# Patient Record
Sex: Female | Born: 1945 | Race: White | Hispanic: No | Marital: Married
Health system: Midwestern US, Academic
[De-identification: ages and names within clinical notes are randomized; demographics above are authoritative.]

---

## 2022-02-14 ENCOUNTER — Encounter: Admit: 2022-02-14 | Discharge: 2022-02-14 | Payer: MEDICARE

## 2022-02-18 ENCOUNTER — Ambulatory Visit: Admit: 2022-02-18 | Discharge: 2022-02-19 | Payer: MEDICARE

## 2022-02-18 ENCOUNTER — Encounter: Admit: 2022-02-18 | Discharge: 2022-02-18 | Payer: MEDICARE

## 2022-02-18 DIAGNOSIS — N952 Postmenopausal atrophic vaginitis: Secondary | ICD-10-CM

## 2022-02-18 DIAGNOSIS — E7800 Pure hypercholesterolemia, unspecified: Secondary | ICD-10-CM

## 2022-02-18 DIAGNOSIS — N993 Prolapse of vaginal vault after hysterectomy: Secondary | ICD-10-CM

## 2022-02-18 DIAGNOSIS — I10 Essential (primary) hypertension: Secondary | ICD-10-CM

## 2022-02-18 DIAGNOSIS — C50919 Malignant neoplasm of unspecified site of unspecified female breast: Secondary | ICD-10-CM

## 2022-02-18 MED ORDER — ESTRADIOL 0.01 % (0.1 MG/GRAM) VA CREA
1 g | VAGINAL | 11 refills | 30.00000 days | Status: AC
Start: 2022-02-18 — End: ?

## 2022-02-18 NOTE — Progress Notes
DATE: 02/18/2022  REFERRING PHYSICIAN: Dr Felecia Jan      CHIEF COMPLAINT: New Patient and Prolapse       History of Present Illness  Theresa Andrade is a 76 y.o. G3P3 PMP female seen for consultation regarding prolapse.  She has had a vaginal hysterectomy in 1977 for abnormal uterine bleeding.     She reports prolapse symptoms that started in 2018. She had a rectocele repair in 2018 in [place] and then noticed another bulge and was told this was her bladder.She has noticed a vaginal bulge that she can see and feel. She does not report having pelvic pressure symptoms.  She does manually reduce the bulge, which she feels like she has to do to void.  She has not tried a pessary.    She reports urinary symptoms, namely difficulty voiding when the bulge is out. She denies leakage of urine with coughing, laughing, and sneezing. She denies leakage of urine with an urge. She does not wear a pad for protection. She does not have symptoms of urinary urgency and frequency. She does feel that she empties her bladder completely if she pushes the bulge back in. She wakes once per night to void.     She reports a history of UTIs, about 1-2 per year, most recently was last year.    She denies bowel symptoms.  She denies fecal incontinence of stool. She has had a colonoscopy in 2017 but was told they were unable to pass the scope due to the rectocele and that she needed to have this repaired before she could have a colonoscopy.     She denies a history of back pain, hip pain, knee pain and foot/ankle pain.     She denies menopausal symptoms. She denies vaginal dryness.    She is not sexually active, has not been since her cancer.        She has a history of breast cancer treated with surgery and chemotherapy. She was on Tamoxifen for 10 years and has been off since 2015. She reports that her mammograms are up to date since.  She reports that her last Pap smear was normal.     REVIEW OF SYSTEMS:  Pertinent Review of Systems:  - General ROS: Denies recent weight change above 10 pounds, Denies malaise  - HEENT ROS: Denies blurring of vision. Denies double vision, Denies glaucoma, Denies dry eyes  - Cardiovascular ROS: Denies chest pain. Denies palpitations currently. Denies orthopnea  - Respiratory ROS: Denies shortness of breath,  Denies cough,  Denies wheezing currently  - Gastrointestinal ROS: Denies constipation, Denies gastric reflux,  Denies blood in stool, Denies diarrhea, Denies Irritable bowel syndrome, Denies nausea, Denies vomiting, Denies abdominal pain  - Endocrine ROS: Denies polydipsia,  Denies excessive sweating. Denies hot flashes. Pos Thyroid disease  - Hematological and Lymphatic ROS: Denies easy bruisability , Denies current anemia, Denies adenopathy in the inguinal area  - Neurological ROS: Denies syncope, Denies numbness in lower extremities, Denies neuropathy. Denies shooting pain down the legs, Denies shooting pain in the lower back, Denies low back pain  - Musculoskeletal ROS: Denies Joint pain. Denies Edema in lower extremities. Denies fibromyalgia  - Psychological ROS: Denies depression, Denies anxiety, Denies thoughts of suicide, Denies thoughts of homicide, Denies recent seizure, Denies syncope   - Dermatological ROS: Denies eczema. Denies skin rashes in the groin area and other sites    ALLERGIES:  Patient has no known allergies.    Medical History:  Diagnosis Date   ? Breast cancer (HCC)    ? High cholesterol    ? HTN (hypertension)        Surgical History:   Procedure Laterality Date   ? HERNIA REPAIR  1995   ? MASTECTOMY, PARTIAL Right 2005   ? HX HYSTERECTOMY         History reviewed. No pertinent family history.    Social History     Socioeconomic History   ? Marital status: Married   Tobacco Use   ? Smoking status: Former     Packs/day: 1.00     Years: 20.00     Pack years: 20.00     Types: Cigarettes     Quit date: 1998     Years since quitting: 25.1   ? Smokeless tobacco: Never   Substance and Sexual Activity   ? Alcohol use: Not Currently   ? Drug use: Never   ? Sexual activity: Yes     Partners: Male         Current Outpatient Medications:   ?  levothyroxine (SYNTHROID) 88 mcg tablet, Take one tablet by mouth daily 30 minutes before breakfast., Disp: , Rfl:   ?  lisinopriL (ZESTRIL) 10 mg tablet, Take one tablet by mouth daily., Disp: , Rfl:   ?  simvastatin (ZOCOR) 20 mg tablet, Take one tablet by mouth at bedtime daily., Disp: , Rfl:         Objective:         ? levothyroxine (SYNTHROID) 88 mcg tablet Take one tablet by mouth daily 30 minutes before breakfast.   ? lisinopriL (ZESTRIL) 10 mg tablet Take one tablet by mouth daily.   ? simvastatin (ZOCOR) 20 mg tablet Take one tablet by mouth at bedtime daily.     Vitals:    02/18/22 0858   BP: 129/58   Pulse: 74   Temp: 36.6 ?C (97.9 ?F)   Resp: 16   SpO2: 100%   TempSrc: Temporal   PainSc: Zero   Weight: 90.7 kg (200 lb)   Height: 167.6 cm (5' 6)     Body mass index is 32.28 kg/m?Marland Kitchen     Physical Exam    General appearance: not in acute distress, walking with normal gait  Mental status :oriented to time, place and person; affect and mood appropriate; normal interaction  Cardiovascular: normal heart rate, bilateral LE pulses palpable  Chest / Respiratory: normal respiratory effort, unlabored breathing   Back: No spine tenderness, no SI joint tenderness, No CVAT  Gastrointestinal / Abdomen: no masses, no rebound, no tenderness, soft, nondistended, no hernias,  Supraumbilical (hiatal Hernia) incisions without tenderness   Extremities: no gross deformities, normal joint mobility   Neruologic: no asymmetric weakness in LE, no abnormalities in sensation in LE  Peripheral vascular system: no cyanosis, no edema in LE, extremities warm  Dermatological: no perineal nevi or rashes noted   Genitourinary:    Post Void Residual  (ml)  Void (spontaneous)       PVR by bladder scan 9 mL        Urinalysis  Leuks                 neg      Nitrites                 neg      Heme neg                      (  one choice per column)  Ext. Genitalia      Lesions noted No    Appearance consistent with age Yes   Vagina       Discharge Present No    Mucosa Atrophic Yes    Tissue Friable No    Tissue Tender To Palpation. No    Bladder      Masses Noted No    Tender to Palpation See below   Urethra      Midline Yes    Normal Size Yes    Urethral Tenderness On Palpation See below    Urethral Discharge Noted No    Any Lesions Noted. No     Caruncle None   Uterus        Normal Size S/p hyst    Mobile S/p hyst    Tender S/p hyst   Adnexa       Masses palpated  none    Tenderness noted NT   Bimanual       Masses palpated  None   Perineum       visually intact. Yes    Anus       External Hemorrhoids None   Myofascial Tenderness      Rt OI  0/10       Rt iliococcygeus  3/10       Rt puboccygeus/puborectalis  0/10       Posterior vaginal  2/10       Lt pubococcygeus/puborectalis  2/10       Lt iliococcygeus  0/10       Lt OI  0/10       Bladder  0/10       Urethra  0/10       Lt vulva  0/10       Rt vulva  0 /10       Reflexes Bulbocavernosus Present    Anal wink Present         Oxford Squeeze strength: (0-5) 2  out of 5    Grade 0:  No discernible contraction Grade 1:   Very weak contraction, a?flicker?Marland Kitchen .  Grade 2:   Weak contraction Grade 3: Moderate contraction with some squeeze and lift ability. Grade 4 :   Good contraction; squeeze and lift against resistance.  Grade 5: Strong contraction, squeeze and lift against strong resistance.         Continence Stress Test (supine stress test)  negative      Pelvic Organ Prolapse Quantification (POP-Q):    Aa: +3 Ba:+4 C: +0.5   GH: 3.5/5.5 PB: 3/3 TVL: 7.5   Ap: -0.5 Bp: -0.5 D: n/a     Stage 3         ASSESSMENT:  Theresa Andrade is a 76 y.o. 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   - She is not sexually active.   - She desires surgical management. We discussed the importance of proceeding with colonoscopy screening prior to elective surgery for her prolapse. She reports another provider expressed concern about her ability to pass the colonoscope due to the prolapse. Based on the anatomy of her prolapse, currently, I do not anticipate any challenges passing the colonoscope. She will discuss rescheduling this with  her PCP  - Return for urodynamics testing and surgical consultation    Vaginal atrophy:   - We discussed the use of vaginal estrogen cream. I discussed the risks of systemic estrogen use. I explained that with vaginal application of 1g 2X per week minimal systemic absorption of estrogen occurs.   - We discussed the risks of not using the cream including atrophy, erosion, increased risk of UTI.   - A prescription for vaginal estrogen was sent to her pharmacy

## 2022-02-18 NOTE — Progress Notes
Pertinent Review of Systems:  - General ROS: Denies recent weight change above 10 pounds, Denies malaise  - HEENT ROS: Denies blurring of vision. Denies double vision, Denies glaucoma, Denies dry eyes  - Cardiovascular ROS: Denies chest pain. Denies palpitations currently. Denies orthopnea  - Respiratory ROS: Denies shortness of breath,  Denies cough,  Denies wheezing currently  - Gastrointestinal ROS: Denies constipation, Denies gastric reflux,  Denies blood in stool, Denies diarrhea, Denies Irritable bowel syndrome, Denies nausea, Denies vomiting, Denies abdominal pain  - Endocrine ROS: Denies polydipsia,  Denies excessive sweating. Denies hot flashes. Pos Thyroid disease  - Hematological and Lymphatic ROS: Denies easy bruisability , Denies current anemia, Denies adenopathy in the inguinal area  - Neurological ROS: Denies syncope, Denies numbness in lower extremities, Denies neuropathy. Denies shooting pain down the legs, Denies shooting pain in the lower back, Denies low back pain  - Musculoskeletal ROS: Denies Joint pain. Denies Edema in lower extremities. Denies fibromyalgia  - Psychological ROS: Denies depression, Denies anxiety, Denies thoughts of suicide, Denies thoughts of homicide, Denies recent seizure, Denies syncope   - Dermatological ROS: Denies eczema. Denies skin rashes in the groin area and other sites

## 2022-02-18 NOTE — Patient Instructions
Thank you for visiting the [facility].   We strive to provide personalized and compassionate care.  We look forward to taking care of you!        The Urogynecology Team,     Providers:       Acie Fredrickson MD, Robbie Lis MD, [facility] PA-C, and Alexander PA-C     Nurses:       Oneida Alar RN, [facility] LPN, [facility] RN, and Waldemar Dickens RN       How to contact us:  Please send a MyChart message to Dr. Randell Patient or call us directly at [phone] for medical questions or future scheduling needs.        Urogynecology is a surgical subspecialty requiring Dr. Enis Gash and Dr. Randell Patient to be in the OR several days of the week.  During these days the nursing staff and physician assistants are available for phone calls and questions. Dr. Randell Patient is in the office seeing patients in clinic on Mondays, alternating Thursdays, and alternating Fridays. Due to our clinic volume, our entire staff is providing patient care with various providers every day.  Urgent messages will be returned.  Non-urgent calls will be addressed by the end of the next business day.  Messages left after 1:00 pm may not be answered until the following business day.    For any emergency outside of normal business hours, please call [phone] and request to have the on-call Gynecologist paged.    How to get a medication refill:   Please request refills through your pharmacy or use the MyChart Refill request. If you have any questions or need further assistance, please call our office [phone]).     How to get approval for a medication insurance is denying:   If a medication is prescribed and insurance is denying coverage for the drug, you have the option of paying for the drug out of pocket or contacting our office to discuss an alternative.  Good Rx (www.goodrx.com) is an online site that can save up to 80% off of your medication cost.      Who do I talk to if I have questions about my bill?  If you need to reach our Billing Department for questions about your bill, call [phone].      How to receive your test results: Results will be directly available in MyChart once finalized. Urine cultures may take up to 72 hours.  OneSwab vaginal cultures, blood work, and pathology can take up to 2 weeks.  Please call our office [phone] if you have not received your results within this time frame or if you have questions about your result.    Scheduling:  Our scheduling phone number is [phone].    Communication preferences can be managed in MyChart to ensure you receive important appointment notifications  ________________________________________________________________________    We have three locations where we see our patients.  Be sure to check your next appointment location.   [facility]    [address]    [place], [place] [address]     MAIN CAMPUS/ MOB ([facility] Office Building at the [facility])    [address]    [place], [place] [address]      [facility] OFFICE    [address], [address]    [place], [place]     _________________________________________________________________________

## 2022-02-18 NOTE — Procedures
Within 10 minutes of voiding, a bladder scan was performed and showed a PVR of 9 mL.

## 2022-02-19 DIAGNOSIS — N8189 Other female genital prolapse: Secondary | ICD-10-CM

## 2022-02-19 DIAGNOSIS — R3989 Other symptoms and signs involving the genitourinary system: Secondary | ICD-10-CM

## 2022-03-20 ENCOUNTER — Encounter: Admit: 2022-03-20 | Discharge: 2022-03-20 | Payer: MEDICARE

## 2022-03-20 ENCOUNTER — Ambulatory Visit: Admit: 2022-03-20 | Discharge: 2022-03-20 | Payer: MEDICARE

## 2022-03-20 DIAGNOSIS — I10 Essential (primary) hypertension: Secondary | ICD-10-CM

## 2022-03-20 DIAGNOSIS — C50919 Malignant neoplasm of unspecified site of unspecified female breast: Secondary | ICD-10-CM

## 2022-03-20 DIAGNOSIS — N993 Prolapse of vaginal vault after hysterectomy: Secondary | ICD-10-CM

## 2022-03-20 DIAGNOSIS — E7800 Pure hypercholesterolemia, unspecified: Secondary | ICD-10-CM

## 2022-03-20 DIAGNOSIS — N952 Postmenopausal atrophic vaginitis: Secondary | ICD-10-CM

## 2022-03-20 MED ORDER — NITROFURANTOIN MONOHYD/M-CRYST 100 MG PO CAP
100 mg | Freq: Once | ORAL | 0 refills | Status: DC
Start: 2022-03-20 — End: 2022-03-20

## 2022-03-20 MED ORDER — PRESURGERY KIT C
PACK | 0 refills | Status: CN
Start: 2022-03-20 — End: ?

## 2022-03-20 MED ORDER — ACETAMINOPHEN 500 MG PO TAB
1000 mg | Freq: Once | ORAL | 0 refills
Start: 2022-03-20 — End: ?

## 2022-03-20 MED ORDER — CEFAZOLIN IN 0.9% SOD CHLORIDE 2 GRAM/110 ML IVPB
2 g | Freq: Once | INTRAVENOUS | 0 refills
Start: 2022-03-20 — End: ?

## 2022-03-20 MED ORDER — LACTATED RINGERS IV SOLP
INTRAVENOUS | 0 refills
Start: 2022-03-20 — End: ?

## 2022-03-20 MED ORDER — AMOXICILLIN 500 MG PO CAP
500 mg | Freq: Once | ORAL | 0 refills | Status: CP
Start: 2022-03-20 — End: ?
  Administered 2022-03-20: 16:00:00 500 mg via ORAL

## 2022-03-20 MED ORDER — GABAPENTIN 300 MG PO CAP
600 mg | Freq: Once | ORAL | 0 refills
Start: 2022-03-20 — End: ?

## 2022-03-20 MED ORDER — CELECOXIB 200 MG PO CAP
200 mg | Freq: Once | ORAL | 0 refills
Start: 2022-03-20 — End: ?

## 2022-03-20 NOTE — Progress Notes
SURGICAL CONSULTATION:    HPI:  Theresa Andrade is a 76 y.o. G3P3 PMP female with Stage 3 post-hysterectomy vaginal vault prolapse. She desires surgical management and returns today for urodynamics and surgical consultation.     She has had a vaginal hysterectomy in 1977 for abnormal uterine bleeding. She had a rectocele repair in 2019 in [place] (operative report in Care Everywhere) and then noticed another bulge and was told this was her bladder.    She went in for a colonoscopy yesterday (03/19/22) but was unable to have it completed. 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 Gleason in 2019. She had a CT yesterday to complete her colon screening and has follow up with Dr. Manson Passey to discuss the results later this month.     Pertinent Review of Systems:  - General ROS:  Denies recent weight change above 10 pounds,  Denies malaise  - HEENT ROS:  Denies blurring of vision.  Denies double vision,  Denies glaucoma,  Denies dry eyes  - Cardiovascular ROS:  Denies chest pain.  Denies palpitations currently.  Denies orthopnea  - Respiratory ROS:  Denies shortness of breath,  Denies cough,  Denies wheezing currently  - Gastrointestinal ROS:  Denies constipation,  Denies gastric reflux,  Denies blood in stool,  Denies diarrhea,  Denies Irritable bowel syndrome,  Denies nausea,  Denies vomiting,  Denies abdominal pain  - Endocrine ROS:  Denies polydipsia,  Denies excessive sweating.  Denies hot flashes.  Denies Thyroid disease  - Hematological and Lymphatic ROS:  Denies easy bruisability,  Denies current anemia,  Denies adenopathy in the inguinal area  - Neurological ROS: Denies syncope,  Denies numbness in lower extremities,  Denies neuropathy.  Denies shooting pain down the legs, Denies shooting pain in the lower back,  Denies low back pain  - Musculoskeletal ROS:  Denies Joint pain.  Denies Edema in lower extremities.  Denies fibromyalgia  - Psychological ROS:  Denies depression,  Denies anxiety,  Denies thoughts of suicide,  Denies thoughts of homicide,  Denies recent seizure,  Denies syncope   - Dermatological ROS:  Denies eczema.  Denies skin rashes in the groin area and other sites    Medical History:   Diagnosis Date   ? Breast cancer (HCC)    ? High cholesterol    ? HTN (hypertension)      Surgical History:   Procedure Laterality Date   ? HERNIA REPAIR  1995   ? MASTECTOMY, PARTIAL Right 2005   ? HX HYSTERECTOMY         OBJECTIVE:  BP 140/75  HR 46  Weight 190 lb  Height 5' 6    Physical Exam    General appearance: not in acute distress, walking with normal gait  Mental status :oriented to time, place and person; affect and mood appropriate; normal interaction  Cardiovascular: normal heart rate, bilateral LE pulses palpable  Chest / Respiratory: normal respiratory effort, unlabored breathing  Gastrointestinal / Abdomen: no masses, no rebound, no tenderness, soft, nondistended, no hernias,  Supraumbilical (hiatal Hernia) incisions without tenderness   Extremities: no gross deformities, normal joint mobility   Neruologic: no asymmetric weakness in LE, no abnormalities in sensation in LE  Peripheral  vascular system: no cyanosis, no edema in LE, extremities warm  Dermatological: no perineal nevi or rashes noted   Genitourinary:   External genitalia: normal appearance   Urethra: no lesions or prolapse   Vagina: no lesions, no discharge   Cervix: surgically absent   Uterus: surgically absent   Adnexa: no masses, no tenderness   Perineum normal appearance     Pelvic Organ Prolapse Quantification (POP-Q, 02/18/22):  ?  Aa: +3 Ba:+4 C: +0.5   GH: 3.5/5.5 PB: 3/3 TVL: 7.5   Ap: -0.5 Bp: -0.5 D: n/a   ?  Stage 3    03/20/22 Urodynamics Impression:  1. Non-instrumented uroflowmetry demonstrated a normal flow pattern. There was not evidence of urinary retention. PVR 60 mL.  2. There was sensory urgency with capacity of 411 mL.  3. Urodynamic stress urinary incontinence was not present.    4. Detrusor overactivity was not present.  5. The voiding pattern was functional.     ASSESSMENT:  Theresa Andrade is a 75 y.o. 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   - She desires reconstructive surgical management. 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 We reviewed options for reconstructive surgical management with native tissue repair vs mesh augmented sacrocolpopexy. She desires sacrocolpopexy.   - We previously discussed the importance of proceeding with colonoscopy screening prior to elective surgery for her prolapse. She reports the colonoscopy was unable to be completed due to a bend in the bowel that did not permit passage of the colonoscope. She was told by Dr. Manson Passey that this was due to a prior surgery where her bowel was attached to her backbone, although she has not had any other surgeries besides the transvaginal rectocele repair done by Dr. Gleason in 2019. The operative report for this is in Care Everywhere, and I reviewed it again today with the patient and her husband.   - I requested she have the records from the attempted colonoscopy, CT imaging, and Dr. Theora Gianotti assessment faxed to my office for review.   - She will need surgical clearance from Dr. Peggye Pitt  - She will likely want surgery in the fall to accommodate her summer activities (gardening, etc). Will plan for a phone visit closer to her date of surgery to review the planned procedure and risks/benefits as well as perioperative recommendations.   - We discussed various non-surgical and surgical options, and again agreed to proceed. We discussed surgical risks, which include, but are not limited to, bleeding, infection, injury to nearby organs including bowel, bladder, ureters, urethra, nerves and blood vessels, as well as the possibility of persistent or recurrent symptoms which may require future surgery. We further discussed the risk of postoperative dyspareunia or pelvic pain, and the potential for postoperative voiding dysfunction. Specifically, we discussed the risk of persistent or recurrent postoperative incontinence, or alternately postoperative urinary retention, either of which may require future surgical correction. With respect to permanent polypropylene mesh placement, we reviewed the recent FDA communications and the ~4% risk of a mesh-related complication related to this procedure, which could include mesh erosion into the vagina, bladder or bowel, or alternately pelvic pain or dyspareunia, which may require future surgery. The patient was amenable to these  risks, and wishes to proceed.     ________________________________________________________________________________________________________________    PLANNED SURGERY:    Laparoscopic Robotic Assisted Sacrocolpopexy (Using laparoscopic instruments a polypropylene mesh would be attached to the top of the vagina and to the sacrum)    Possible Posterior Vaginal Repair (Through a vaginal incision, the tissue between the vagina & rectum is brought together with suture to return the rectum to its anatomical position & strengthen the support )    Cystourethroscopy (look through a scope at the urethra and bladder)    RISKS OF SURGERY:    _____ Anesthesia or medication reaction  _____ DVT/PE (blood clot)  _____ Need for catheter use in the post-op period  _____ Chronic urinary retention  _____ Risk of a urinary tract infection  _____ Bleeding with associated risk of organ failure/injury or death.   _____ Need for additional surgery  _____ New-onset urinary incontinence or worsened urge incontinence  _____ Mesh complication  _____ Injury to surrounding muscles, nerves, blood vessels or organs (bladder, urethra, ureter, bowel)  _____ Fistulae formation  _____ Post-op pain  _____ Pain with intercourse  _____ Recurrent vaginal prolapse   _____ Infection, hematoma, or keloid/scar tissue at incision site  _____ Positioning injury

## 2022-03-20 NOTE — Progress Notes
Pertinent Review of Systems:  - General ROS:  Denies recent weight change above 10 pounds,  Denies malaise  - HEENT ROS:  Denies blurring of vision.  Denies double vision,  Denies glaucoma,  Denies dry eyes  - Cardiovascular ROS:  Denies chest pain.  Denies palpitations currently.  Denies orthopnea  - Respiratory ROS:  Denies shortness of breath,  Denies cough,  Denies wheezing currently  - Gastrointestinal ROS:  Denies constipation,  Denies gastric reflux,  Denies blood in stool,  Denies diarrhea,  Denies Irritable bowel syndrome,  Denies nausea,  Denies vomiting,  Denies abdominal pain  - Endocrine ROS:  Denies polydipsia,  Denies excessive sweating.  Denies hot flashes.  Denies Thyroid disease  - Hematological and Lymphatic ROS:  Denies easy bruisability,  Denies current anemia,  Denies adenopathy in the inguinal area  - Neurological ROS: Denies syncope,  Denies numbness in lower extremities,  Denies neuropathy.  Denies shooting pain down the legs,  Denies shooting pain in the lower back,  Denies low back pain  - Musculoskeletal ROS:  Denies Joint pain.  Denies Edema in lower extremities.  Denies fibromyalgia  - Psychological ROS:  Denies depression,  Denies anxiety,  Denies thoughts of suicide,  Denies thoughts of homicide,  Denies recent seizure,  Denies syncope   - Dermatological ROS:  Denies eczema.  Denies skin rashes in the groin area and other sites

## 2022-03-20 NOTE — Progress Notes
Pertinent Review of Systems:  - General ROS:  Denies recent weight change above 10 pounds,  Denies malaise  - HEENT ROS:  Denies blurring of vision.  Denies double vision,  Denies glaucoma,  Denies dry eyes  - Cardiovascular ROS:  Denies chest pain.  Denies palpitations currently.  Denies orthopnea  - Respiratory ROS:  Denies shortness of breath,  Denies cough,  Denies wheezing currently  - Gastrointestinal ROS:  Denies constipation,  Denies gastric reflux,  Denies blood in stool, Denies diarrhea,  Denies Irritable bowel syndrome,  Denies nausea,  Denies vomiting,  Denies abdominal pain  - Endocrine ROS:  Denies polydipsia,  Denies excessive sweating.  Denies hot flashes.  Denies Thyroid disease  - Hematological and Lymphatic ROS:  Denies easy bruisability,  Denies current anemia,  Denies adenopathy in the inguinal area  - Neurological ROS:  Denies syncope,  Denies numbness in lower extremities,  Denies neuropathy.  Denies shooting pain down the legs,  Denies shooting pain in the lower back,  Denies low back pain  - Musculoskeletal ROS:  Denies Joint pain.  Denies Edema in lower extremities.  Denies fibromyalgia  - Psychological ROS:  Denies depression,  Denies anxiety,  Denies thoughts of suicide,  Denies thoughts of homicide,  Denies recent seizure,  Denies syncope   - Dermatological ROS:  Denies eczema.  Denies skin rashes in the groin area and other sites

## 2022-03-20 NOTE — Patient Instructions
Today you underwent a urodynamics procedure.  It is normal for some patients to feel discomfort with voiding (urinating) for the following 24 hours.  You may see a pink color of urine on the toilet paper with wiping after the first few times you void (urinate).      Urinary tract infections can occur after urodynamics, although very rarely.  We often administer an oral antibiotic to you at the time of the procedure.  If your physician has concerns that you are more at risk of developing a urinary tract infection, you will be prescribed additional antibiotics to take for several more days.      Please call our office if you develop symptoms of a UTI:   Burning with urination lasting more than 24 hours after the procedure   Bladder pressure    New onset of increased frequency of needing to go to the bathroom in the day or night   Increased urgency associated with voiding/urinating   New onset of low back pain that is not in the middle of the back   Chills or low grade fever (temperature above 101.4)    Symptoms of a more severe urinary tract infection that could involve the kidneys are uncommon.  Please go to the nearest emergency room or urgent care if you develop:   Bloody urine   Mental confusion   Mid back pain   Temperature >101.4 degrees   Chills

## 2022-03-20 NOTE — Procedures
UDS Report  Indication:  1. Stage 3 post-hysterectomy vaginal prolapse, sensation of incomplete emptying, desires reconstructive surgical management    Procedure:  The patient was brought to the urodynamics room. Consent was obtained and time out was performed verifying the correct patient and procedure. She was asked to void with evaluation by uroflow. A urine dip confirmed no evidence of infection.     A 7 Fr urodynamic catheter was placed intravesically (Pura, Pves) and in the rectum (Pabd). There was reduction of vaginal prolapse with 3 proctoswabs. EMG stickers were affixed to the bilateral buttock and grounded on the patient's knee. The patient was in the sitting position and was asked to cough to confirm proper placement of the pressure catheters. Cystometrogram was performed with filling at up to 50 mL/min. Incremental sensations of fullness were recorded, and a cough stress test and valsalva stress test were performed as below. The patient was asked to void with the catheters in place for a pressure flow study. At the conclusion of the study all of the catheters, EMG stickers, and prolapse reducing devices were removed.     Complex Uroflowmetry:  Qmax: 34  ml/sec  Qmean: 17 ml/sec  Flow Pattern: Normal flow pattern   Voided Volume: 418 ml  PVR: 60 ml  UA: negative    Cystometrogram:  Using 76F water filled catheters the bladder was filled at 71ml/min.  Test performed in sitting position at 45 deg.  Prolapse: Yes  Reduction method: procto swabs  First sensation: 84 ml   DO: No   First urge: 144 ml  Strong urge: 382 ml  MCC: 411 ml  EMG activity:Appropriate activity with filling/cough/valsalva    Stress testing @  150 ml  Cough: No Leak  Valsalva: No Leak  300 ml  Cough: No Leak  Valsalva: No Leak  MCC (411 ml)  Cough: No Leak  Valsalva: No Leak    Pressure-Flow study:  Voided vol: 403 ml  Max flow rate: 22 ml/sec  Det pressure at max flow: 1.6 cmH20  Flow Pattern: Normal flow pattern   Voiding mechanism: Mixed  EMG activity: Normal, no activity during void  Calculated PVR: 7 ml    03/20/22 Urodynamics Impression:  1. Non-instrumented uroflowmetry demonstrated a normal flow pattern. There was not evidence of urinary retention. PVR 60 mL.  2. There was sensory urgency with capacity of 411 mL.  3. Urodynamic stress urinary incontinence was not present.    4. Detrusor overactivity was not present.  5. The voiding pattern was functional.

## 2022-04-15 ENCOUNTER — Encounter: Admit: 2022-04-15 | Discharge: 2022-04-15 | Payer: MEDICARE | Primary: Family

## 2022-08-20 ENCOUNTER — Encounter: Admit: 2022-08-20 | Discharge: 2022-08-20 | Payer: MEDICARE | Primary: Family

## 2022-08-28 ENCOUNTER — Encounter: Admit: 2022-08-28 | Discharge: 2022-08-28 | Payer: MEDICARE | Primary: Family

## 2022-08-30 ENCOUNTER — Encounter: Admit: 2022-08-30 | Discharge: 2022-08-30 | Payer: MEDICARE | Primary: Family

## 2022-09-02 ENCOUNTER — Encounter: Admit: 2022-09-02 | Discharge: 2022-09-02 | Payer: MEDICARE

## 2022-09-02 ENCOUNTER — Ambulatory Visit: Admit: 2022-09-02 | Discharge: 2022-09-02 | Payer: MEDICARE

## 2022-09-02 DIAGNOSIS — C50919 Malignant neoplasm of unspecified site of unspecified female breast: Secondary | ICD-10-CM

## 2022-09-02 DIAGNOSIS — E7800 Pure hypercholesterolemia, unspecified: Secondary | ICD-10-CM

## 2022-09-02 DIAGNOSIS — I10 Essential (primary) hypertension: Secondary | ICD-10-CM

## 2022-09-02 DIAGNOSIS — N993 Prolapse of vaginal vault after hysterectomy: Secondary | ICD-10-CM

## 2022-09-02 DIAGNOSIS — N952 Postmenopausal atrophic vaginitis: Secondary | ICD-10-CM

## 2022-09-02 MED ORDER — PRESURGERY KIT C
PACK | 0 refills | Status: AC
Start: 2022-09-02 — End: ?
  Filled 2022-09-03: qty 1, 1d supply, fill #1

## 2022-09-02 NOTE — Progress Notes
CHIEF COMPLAINT:   Chief Complaint   Patient presents with   ? Pre Operative Visit       History of Present Illness:   Theresa Andrade is a 76 y.o. G3P3 female who presents for follow up for a pre-op visit.    She has Stage 3 post-hysterectomy vaginal vault prolapse and desires reconstructive surgical management with robot-assisted laparoscopic sacrocolpopexy. She was last seen on 03/20/22 for urodynamics testing. She is an avid gardener and wanted to defer her surgery to the fall. Since her last visit, she reports her husband was diagnosed with colon cancer and has undergone colectomy. He was subsequently diagnosed with lung cancer, at least Stage 3, and has follow up testing later this week with an appointment with his doctor the following week. They are uncertain of his treatment course but expect it to include chemotherapy and radiation.     She had an attempted colonoscopy on 03/19/22 but it was unable to be completed due to a bend in the bowel that did not permit passage of the colonoscope. She had a CT yesterday to complete her colon screening and has follow up with Dr. Manson Passey to discuss the results later this month. Records from the colonoscopy, CT, and Dr. Theora Gianotti assessment were requested but have not yet been received.     She was to have surgical clearance from her PCP faxed as well but she has gotten a new PCP and has not yet been in to see him.     She has?had a vaginal?hysterectomy in?1977?for abnormal uterine bleeding. She had a rectocele repair in 2019 in [place] (operative report in Care Everywhere) and then noticed another bulge and was told this was her bladder.        ALLERGIES:  Patient has no known allergies.    MEDICATIONS:    Current Outpatient Medications:   ?  estradioL (ESTRACE) 0.01 % (0.1 mg/g) vaginal cream, Insert or Apply one g to vaginal area twice weekly., Disp: 42.5 g, Rfl: 11  ?  levothyroxine (SYNTHROID) 88 mcg tablet, Take one tablet by mouth daily 30 minutes before breakfast., Disp: , Rfl:   ?  lisinopriL (ZESTRIL) 10 mg tablet, Take one tablet by mouth daily., Disp: , Rfl:   ?  PRESURGERY KIT C, Use as directed.  Indications: ERAS pre-surgical preparation, Disp: 1 kit, Rfl: 0  ?  simvastatin (ZOCOR) 20 mg tablet, Take one tablet by mouth at bedtime daily., Disp: , Rfl:     Medical History:   Diagnosis Date   ? Breast cancer (HCC)    ? High cholesterol    ? HTN (hypertension)        Surgical History:   Procedure Laterality Date   ? HERNIA REPAIR  1995   ? MASTECTOMY, PARTIAL Right 2005   ? RECTOCELE REPAIR  2019   ? HX HYSTERECTOMY         No family history on file.    Social History     Tobacco Use   ? Smoking status: Former     Packs/day: 1.00     Years: 20.00     Additional pack years: 0.00     Total pack years: 20.00     Types: Cigarettes     Quit date: 60     Years since quitting: 25.7   ? Smokeless tobacco: Never   Substance Use Topics   ? Alcohol use: Not Currently           OBJECTIVE:  Physical Examination:  General appearance/Vital Signs: BP 118/67  - Pulse 52  - Temp 36.4 ?C (97.6 ?F) (Temporal)  - Resp 16  - Ht 167.6 cm (5' 6)  - Wt 89.2 kg (196 lb 9.6 oz)  - SpO2 100%  - BMI 31.73 kg/m? , not in acute distress, well groomed  Eyes: Sclera white, no nystagmus  Ears/Nose/Throat: Lips and gums appear normal,adequate hearing ability   Psychiatric / Mental status: oriented to time, place and person; affect and mood appropriate; normal interaction  Cardiovascular / Peripheral vascular system: no prominent varicose veins involving the LE, no cyanosis, no visible edema in LE  Chest / Respiratory: normal respiratory effort, unlabored breathing  Musculoskeletal / Extremities: walking with normal gait, normal joint mobility noted visibly in LE bilat  Gastrointestinal / Lower Abdomen: no direct tenderness, soft, nondistended, Supraumbilical (hiatal Hernia)?incisions without tenderness  Lymphatic: No lymphadenopathy of the groin on both sides  Dermatological: suprapubic and vulvar skin inspected visually without any new lesions noted, suprapubic and vulvar skin palpated with no masses felt  Neurologic: sensation over pubis normal, sensation over labia minora normal   Pelvic:  - Ext. Genitalia - No visible lesions noted involving the labia majora or minora   - Urethra: midline, non-erythematous, no prolapse seen  - Vagina: no lesions  - Cervix: surgically absent  - Uterus: surgically absent  - Adnexa: no masses  - Perineum: normal appearance    Pelvic Organ Prolapse Quantification (POP-Q, 02/18/22):  ?  Aa:?+3 Ba:+4 C:?+0.5   GH:?3.5/5.5 PB:?3/3 TVL:?7.5   Ap:?-0.5 Bp:?-0.5 D:?n/a   ?  Stage?3  ?  03/20/22 Urodynamics Impression:  1. Non-instrumented uroflowmetry demonstrated a normal flow pattern. There was not evidence of urinary retention. PVR 60 mL.  2. There was sensory urgency with capacity of 411 mL.  3. Urodynamic stress urinary incontinence was not present.    4. Detrusor overactivity was not present.  5. The voiding pattern was functional.       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 She desires reconstructive surgical management for her recurrent vaginal prolapse.   ?  PLAN:  Prolapse:   - She desires reconstructive surgical management. 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 We reviewed options for reconstructive surgical management with native tissue repair vs mesh augmented sacrocolpopexy. She desires sacrocolpopexy and is scheduled for surgery on 10/17/22. Her husband was recently diagnosed with colon cancer (now s/p colectomy) and lung cancer (workup pending). We discussed considering rescheduling her surgery while he is actively undergoing treatment. She would prefer to get through his work up and follow up with his physician prior to considering changing her surgery date. Will touch base in 2 weeks.   - We previously discussed the importance of proceeding with colonoscopy screening prior to elective surgery for her prolapse and reviewed this again today. She reports the colonoscopy was unable to be completed due to a bend in the bowel that did not permit passage of the colonoscope. She was told by Dr. Manson Passey that this was due to a prior surgery where her bowel was attached to her backbone, although she has not had any other surgeries besides the transvaginal rectocele repair done by Dr. Gleason in 2019. The operative report for this is in Care Everywhere, and I reviewed it again with the patient and her husband. I previously requested she have the records  from the attempted colonoscopy, CT imaging, and Dr. Theora Gianotti assessment faxed to my office for review, but these have not been received. She will follow up with Dr. Theora Gianotti office again and was given the fax number again today. My nurse also reached out to Dr. Theora Gianotti office with a request for records.   - She will need surgical clearance from her PCP, now Dr. Salley Slaughter. She was instructed to call his office and schedule this.   - We discussed various non-surgical and surgical options, and again agreed to proceed. We discussed surgical risks, which include, but are not limited to, bleeding, infection, injury to nearby organs including bowel, bladder, ureters, urethra, nerves and blood vessels, as well as the possibility of persistent or recurrent symptoms which may require future surgery. We further discussed the risk of postoperative dyspareunia or pelvic pain, and the potential for postoperative voiding dysfunction. Specifically, we discussed the risk of persistent or recurrent postoperative incontinence, or alternately postoperative urinary retention, either of which may require future surgical correction. With respect to permanent polypropylene mesh placement, we reviewed the recent FDA communications and the ~4% risk of a mesh-related complication related to this procedure, which could include mesh erosion into the vagina, bladder or bowel, or alternately pelvic pain or dyspareunia, which may require future surgery. The patient was amenable to these risks, and wishes to proceed.   ?  ________________________________________________________________________________________________________________  ?  PLANNED SURGERY:  ?  Laparoscopic Robotic Assisted Sacrocolpopexy (Using laparoscopic instruments a polypropylene mesh would be attached to the top of the vagina and to the sacrum)  ?  Possible Posterior Vaginal Repair (Through a vaginal incision, the tissue between the vagina & rectum is brought together with suture to return the rectum to its anatomical position & strengthen the support )  ?  Cystourethroscopy (look through a scope at the urethra and bladder)  ?  RISKS OF SURGERY:  ?  _____ Anesthesia or medication reaction  _____ DVT/PE (blood clot)  _____ Need for catheter use in the post-op period  _____ Chronic urinary retention  _____ Risk of a urinary tract infection  _____ Bleeding with associated risk of organ failure/injury or death.   _____ Need for additional surgery  _____ New-onset urinary incontinence or worsened urge incontinence  _____ Mesh complication  _____ Injury to surrounding muscles, nerves, blood vessels or organs (bladder, urethra, ureter, bowel)  _____ Fistulae formation  _____ Post-op pain  _____ Pain with intercourse  _____ Recurrent vaginal prolapse   _____ Infection, hematoma, or keloid/scar tissue at incision site  _____ Positioning injury

## 2022-09-03 ENCOUNTER — Encounter: Admit: 2022-09-03 | Discharge: 2022-09-03 | Payer: MEDICARE

## 2022-09-16 ENCOUNTER — Encounter: Admit: 2022-09-16 | Discharge: 2022-09-16 | Payer: MEDICARE

## 2022-09-16 MED FILL — PRESURGERY KIT C: 1 days supply | Qty: 1 | Fill #1 | Status: AC

## 2022-09-17 ENCOUNTER — Encounter: Admit: 2022-09-17 | Discharge: 2022-09-17 | Payer: MEDICARE

## 2022-09-26 ENCOUNTER — Encounter: Admit: 2022-09-26 | Discharge: 2022-09-26 | Payer: MEDICARE

## 2022-09-26 ENCOUNTER — Ambulatory Visit: Admit: 2022-09-26 | Discharge: 2022-09-26 | Payer: MEDICARE

## 2022-09-26 DIAGNOSIS — I10 Essential (primary) hypertension: Secondary | ICD-10-CM

## 2022-09-26 DIAGNOSIS — C50919 Malignant neoplasm of unspecified site of unspecified female breast: Secondary | ICD-10-CM

## 2022-09-26 DIAGNOSIS — Z01818 Encounter for other preprocedural examination: Secondary | ICD-10-CM

## 2022-09-26 DIAGNOSIS — Z8719 Personal history of other diseases of the digestive system: Secondary | ICD-10-CM

## 2022-09-26 DIAGNOSIS — N183 CKD (chronic kidney disease) stage 3, GFR 30-59 ml/min (HCC): Secondary | ICD-10-CM

## 2022-09-26 DIAGNOSIS — N819 Female genital prolapse, unspecified: Secondary | ICD-10-CM

## 2022-09-26 DIAGNOSIS — E7800 Pure hypercholesterolemia, unspecified: Secondary | ICD-10-CM

## 2022-09-26 DIAGNOSIS — J449 Chronic obstructive pulmonary disease, unspecified: Secondary | ICD-10-CM

## 2022-09-26 LAB — BASIC METABOLIC PANEL
ANION GAP: 7 pg (ref 3–12)
BLD UREA NITROGEN: 21 mg/dL (ref 7–25)
CALCIUM: 8.9 mg/dL (ref 8.5–10.6)
CHLORIDE: 107 MMOL/L (ref 98–110)
CO2: 24 MMOL/L (ref 21–30)
CREATININE: 1 mg/dL — ABNORMAL HIGH (ref 0.4–1.00)
EGFR: 55 mL/min — ABNORMAL LOW (ref >60)
GLUCOSE,PANEL: 103 mg/dL — ABNORMAL HIGH (ref 70–100)
POTASSIUM: 4.1 MMOL/L (ref 3.5–5.1)
SODIUM: 138 MMOL/L (ref 137–147)

## 2022-09-26 LAB — CBC: WBC COUNT: 4.2 K/UL — ABNORMAL LOW (ref 4.5–11.0)

## 2022-09-26 NOTE — Unmapped
[facility] Anesthesia Pre-Procedure Evaluation    Name: Theresa Andrade      MRN: [id number]     DOB: 04-Feb-1946     Age: 76 y.o.     Sex: female   _________________________________________________________________________     Procedure Info:   Procedure Information     Date/Time: 10/17/22 0745    Procedures:       ROBOT ASSISTED LAPAROSCOPIC COLPOPEXY - CASE LENGTH 4.5 HRS, PLEASE CLIP PT IN PRE-POST      CYSTOURETHROSCOPY - Please have 70 and 0 degree cystoscope in room and 1L NS    Location: MAIN OR 18 / Main OR/Periop    Surgeons: Shirley Friar, MD          Physical Assessment  Vital Signs (last filed in past 24 hours):  BP: 128/60 (10/12 1101)  Temp: 36.1 ?C (96.9 ?F) (10/12 1101)  Pulse: 50 (10/12 1101)  Respirations: 14 PER MINUTE (10/12 1101)  SpO2: 99 % (10/12 1101)  O2 Device: None (Room air) (10/12 1101)  Height: 167.6 cm (5' 6) (10/12 1101)  Weight: 89.8 kg (198 lb) (10/12 1101)  Admission / Dosing Weight: 89.8 kg (198 lb) (10/12 1101)      Patient History   No Known Allergies     Current Medications    Medication Directions   estradioL (ESTRACE) 0.01 % (0.1 mg/g) vaginal cream Insert or Apply one g to vaginal area twice weekly.   levothyroxine (SYNTHROID) 88 mcg tablet Take one tablet by mouth daily 30 minutes before breakfast.   lisinopriL (ZESTRIL) 10 mg tablet Take one tablet by mouth daily.   simvastatin (ZOCOR) 20 mg tablet Take one tablet by mouth at bedtime daily.       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    Abdominal Findings:       Obese    Neurological Findings:       Alert and oriented x 3    Constitutional findings:       No acute distress       Diagnostic Tests  Hematology: No results found for: HGB, HCT, PLTCT, WBC, NEUT, ANC, LYMPH, ALC, ABSLYMPHCT, MONA, AMC, EOSA, ABC, BASOPHILS, MCV, MCH, MCHC, MPV, RDW      General Chemistry: No results found for: NA, K, CL, CO2, GAP, BUN, CR, GLU, CA, KETONES, ALBUMIN, LACTIC, OBSCA, MG, TOTBILI, TOTBILCB, PO4   Coagulation: No results found for: PT, PTT, INR    09/14/21 Event monitor  The baseline rhythm is sinus rhythm range between a minimum heart rate of 39  beats a minute occurring in the early morning hours to a maximum heart rate  of 103 beats a minute.  The average heart rate is 54 beats a minute.  During  the recording.  There is no ventricular ectopic beats.  There were rare PACs  with 4 beat run of PAT.  There is no sustained atrial or ventricular  arrhythmias.  There is no high degree AV AV block or significant pauses.  The  monitor suggest that there was atrial for but no monitored strips show atrial  fib during the study.  I think this is likely spuriously result.  ?  09/11/21 Echocardiogram  Left ventricle is normal in size. There is normal global LV systolic performance with no segmental wall motion abnormalities. The ejection fraction is measured to be 67% which is consistent with visual estimate. The left wall thickness is within normal limits.   The mitral valve is unremarkable with mild to moderate mitral regurgitation.   The aortic valve architecture is difficult to determine. 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 No significant stenosis or insufficiency is demonstrated. The peak gradient is 17 mmHg.    PAC Plan    Interview: Clinic Interview    ASA Score: 3    Anesthesia Options Discussed: General      Prior Records Requested:  ECG      Labs/Studies Ordered Prior to DOS:  CBC, BMP and T&S  DOS Orders: (per surgeon)  CBC and T&C        PAC RISK ASSESSMENTS:   *D[facility] Activity Status Index (DASI): 39.45, Calculated METS: 7.59 (score < 25 correlates with increased risk for death, MI, and moderate to severe complications, max score 57)  *STOP-BANG Score: 3 (total 5-8 high risk for OSA, 3-4 with HCO3 >28 also high risk. OSA associated with greater than twice the odds for respiratory failure, cardiac events, ICU admission, and difficult intubation)        Alerts

## 2022-09-27 ENCOUNTER — Encounter: Admit: 2022-09-27 | Discharge: 2022-09-27 | Payer: MEDICARE

## 2022-09-27 NOTE — Telephone Encounter
Spoke with Theresa Andrade who reports her niece will be available to bring her for surgery and she would like to proceed on 10/17/22.

## 2022-10-17 ENCOUNTER — Encounter: Admit: 2022-10-17 | Discharge: 2022-10-17 | Payer: MEDICARE

## 2022-10-17 DIAGNOSIS — C50919 Malignant neoplasm of unspecified site of unspecified female breast: Secondary | ICD-10-CM

## 2022-10-17 DIAGNOSIS — Z8719 Personal history of other diseases of the digestive system: Secondary | ICD-10-CM

## 2022-10-17 DIAGNOSIS — J449 Chronic obstructive pulmonary disease, unspecified: Secondary | ICD-10-CM

## 2022-10-17 DIAGNOSIS — N819 Female genital prolapse, unspecified: Secondary | ICD-10-CM

## 2022-10-17 DIAGNOSIS — E7800 Pure hypercholesterolemia, unspecified: Secondary | ICD-10-CM

## 2022-10-17 DIAGNOSIS — N183 CKD (chronic kidney disease) stage 3, GFR 30-59 ml/min (HCC): Secondary | ICD-10-CM

## 2022-10-17 DIAGNOSIS — I10 Essential (primary) hypertension: Secondary | ICD-10-CM

## 2022-10-17 MED ORDER — PROPOFOL INJ 10 MG/ML IV VIAL
INTRAVENOUS | 0 refills | Status: DC
Start: 2022-10-17 — End: 2022-10-17

## 2022-10-17 MED ORDER — ROCURONIUM 10 MG/ML IV SOLN
INTRAVENOUS | 0 refills | Status: DC
Start: 2022-10-17 — End: 2022-10-17

## 2022-10-17 MED ORDER — ONDANSETRON HCL (PF) 4 MG/2 ML IJ SOLN
INTRAVENOUS | 0 refills | Status: DC
Start: 2022-10-17 — End: 2022-10-17

## 2022-10-17 MED ORDER — DEXAMETHASONE SODIUM PHOSPHATE 4 MG/ML IJ SOLN
INTRAVENOUS | 0 refills | Status: DC
Start: 2022-10-17 — End: 2022-10-17

## 2022-10-17 MED ORDER — SUGAMMADEX 100 MG/ML IV SOLN
INTRAVENOUS | 0 refills | Status: DC
Start: 2022-10-17 — End: 2022-10-17

## 2022-10-17 MED ORDER — INDIGOTINDISULFONATE SODIUM 8 MG/ML (0.8 %) IV SOLN
INTRAVENOUS | 0 refills | Status: DC
Start: 2022-10-17 — End: 2022-10-17

## 2022-10-17 MED ORDER — GLYCOPYRROLATE 0.2 MG/ML IJ SOLN
INTRAVENOUS | 0 refills | Status: DC
Start: 2022-10-17 — End: 2022-10-17

## 2022-10-17 MED ORDER — ARTIFICIAL TEARS (PF) SINGLE DOSE DROPS GROUP
OPHTHALMIC | 0 refills | Status: DC
Start: 2022-10-17 — End: 2022-10-17

## 2022-10-17 MED ORDER — FENTANYL CITRATE (PF) 50 MCG/ML IJ SOLN
INTRAVENOUS | 0 refills | Status: DC
Start: 2022-10-17 — End: 2022-10-17

## 2022-10-17 MED ORDER — PHENYLEPHRINE HCL IN 0.9% NACL 1 MG/10 ML (100 MCG/ML) IV SYRG
INTRAVENOUS | 0 refills | Status: DC
Start: 2022-10-17 — End: 2022-10-17

## 2022-10-17 MED ORDER — ACETAMINOPHEN 1,000 MG/100 ML (10 MG/ML) IV SOLN
INTRAVENOUS | 0 refills | Status: DC
Start: 2022-10-17 — End: 2022-10-17

## 2022-10-17 MED ORDER — HYDROMORPHONE (PF) 2 MG/ML IJ SYRG
INTRAVENOUS | 0 refills | Status: DC
Start: 2022-10-17 — End: 2022-10-17

## 2022-10-17 MED ORDER — LIDOCAINE (PF) 200 MG/10 ML (2 %) IJ SYRG
INTRAVENOUS | 0 refills | Status: DC
Start: 2022-10-17 — End: 2022-10-17

## 2022-10-17 MED ORDER — EPHEDRINE SULFATE 50 MG/ML IV SOLN
INTRAVENOUS | 0 refills | Status: DC
Start: 2022-10-17 — End: 2022-10-17

## 2022-10-17 MED ADMIN — CELECOXIB 200 MG PO CAP [76958]: 200 mg | ORAL | @ 12:00:00 | Stop: 2022-10-17 | NDC 00904650361

## 2022-10-17 MED ADMIN — LACTATED RINGERS IV SOLP [4318]: 1000.000 mL | INTRAVENOUS | @ 18:00:00 | Stop: 2022-10-18 | NDC 00338011704

## 2022-10-17 MED ADMIN — ACETAMINOPHEN 500 MG PO TAB [102]: 1000 mg | ORAL | @ 12:00:00 | Stop: 2022-10-17 | NDC 00904672080

## 2022-10-17 MED ADMIN — CEFAZOLIN INJ 1GM IVP [210319]: 2 g | INTRAVENOUS | @ 17:00:00 | Stop: 2022-10-17 | NDC 00143992490

## 2022-10-17 MED ADMIN — LACTATED RINGERS IV SOLP [4318]: 1000.000 mL | INTRAVENOUS | @ 12:00:00 | Stop: 2022-10-18 | NDC 00338011704

## 2022-10-17 MED ADMIN — GABAPENTIN 300 MG PO CAP [18308]: 600 mg | ORAL | @ 12:00:00 | Stop: 2022-10-17 | NDC 67877022305

## 2022-10-17 MED ADMIN — CEFAZOLIN INJ 1GM IVP [210319]: 2 g | INTRAVENOUS | @ 13:00:00 | Stop: 2022-10-17 | NDC 00143992490

## 2022-10-17 MED ADMIN — CEFAZOLIN INJ 1GM IVP [210319]: 2 g | INTRAVENOUS | @ 21:00:00 | Stop: 2022-10-17 | NDC 00143992490

## 2022-10-18 ENCOUNTER — Encounter: Admit: 2022-10-18 | Discharge: 2022-10-18 | Payer: MEDICARE

## 2022-10-18 MED ADMIN — LEVOTHYROXINE 88 MCG PO TAB [10403]: 88 ug | ORAL | @ 11:00:00 | Stop: 2022-10-18 | NDC 00904695261

## 2022-10-18 MED ADMIN — SODIUM CHLORIDE 0.9 % IV SOLP [27838]: 1000 mL | INTRAVENOUS | @ 13:00:00 | Stop: 2022-10-18 | NDC 00338004904

## 2022-10-18 MED ADMIN — SIMVASTATIN 20 MG PO TAB [80437]: 20 mg | ORAL | @ 01:00:00 | NDC 63739057210

## 2022-10-18 MED FILL — ONDANSETRON 4 MG PO TBDI: 4 mg | ORAL | 8 days supply | Qty: 30 | Fill #1 | Status: AC

## 2022-10-18 MED FILL — OXYCODONE 5 MG PO TAB: 5 mg | ORAL | 4 days supply | Qty: 15 | Fill #1 | Status: AC

## 2022-10-19 ENCOUNTER — Encounter: Admit: 2022-10-19 | Discharge: 2022-10-19 | Payer: MEDICARE

## 2022-10-19 DIAGNOSIS — I10 Essential (primary) hypertension: Secondary | ICD-10-CM

## 2022-10-19 DIAGNOSIS — E7800 Pure hypercholesterolemia, unspecified: Secondary | ICD-10-CM

## 2022-10-19 DIAGNOSIS — Z8719 Personal history of other diseases of the digestive system: Secondary | ICD-10-CM

## 2022-10-19 DIAGNOSIS — N183 CKD (chronic kidney disease) stage 3, GFR 30-59 ml/min (HCC): Secondary | ICD-10-CM

## 2022-10-19 DIAGNOSIS — J449 Chronic obstructive pulmonary disease, unspecified: Secondary | ICD-10-CM

## 2022-10-19 DIAGNOSIS — C50919 Malignant neoplasm of unspecified site of unspecified female breast: Secondary | ICD-10-CM

## 2022-10-19 DIAGNOSIS — N819 Female genital prolapse, unspecified: Secondary | ICD-10-CM

## 2022-10-21 ENCOUNTER — Encounter: Admit: 2022-10-21 | Discharge: 2022-10-21 | Payer: MEDICARE

## 2022-10-23 ENCOUNTER — Encounter: Admit: 2022-10-23 | Discharge: 2022-10-23 | Payer: MEDICARE

## 2022-10-24 ENCOUNTER — Encounter: Admit: 2022-10-24 | Discharge: 2022-10-24 | Payer: MEDICARE

## 2022-11-05 ENCOUNTER — Encounter: Admit: 2022-11-05 | Discharge: 2022-11-05 | Payer: MEDICARE

## 2022-11-25 ENCOUNTER — Encounter: Admit: 2022-11-25 | Discharge: 2022-11-25 | Payer: MEDICARE

## 2022-11-25 ENCOUNTER — Ambulatory Visit: Admit: 2022-11-25 | Discharge: 2022-11-26 | Payer: MEDICARE

## 2022-11-25 DIAGNOSIS — Z8719 Personal history of other diseases of the digestive system: Secondary | ICD-10-CM

## 2022-11-25 DIAGNOSIS — N183 CKD (chronic kidney disease) stage 3, GFR 30-59 ml/min (HCC): Secondary | ICD-10-CM

## 2022-11-25 DIAGNOSIS — I10 Essential (primary) hypertension: Secondary | ICD-10-CM

## 2022-11-25 DIAGNOSIS — N819 Female genital prolapse, unspecified: Secondary | ICD-10-CM

## 2022-11-25 DIAGNOSIS — J449 Chronic obstructive pulmonary disease, unspecified: Secondary | ICD-10-CM

## 2022-11-25 DIAGNOSIS — Z4889 Encounter for other specified surgical aftercare: Secondary | ICD-10-CM

## 2022-11-25 DIAGNOSIS — N952 Postmenopausal atrophic vaginitis: Secondary | ICD-10-CM

## 2022-11-25 DIAGNOSIS — E7800 Pure hypercholesterolemia, unspecified: Secondary | ICD-10-CM

## 2022-11-25 DIAGNOSIS — C50919 Malignant neoplasm of unspecified site of unspecified female breast: Secondary | ICD-10-CM

## 2022-11-25 NOTE — Progress Notes
Post-op Visit  HPI:  Theresa Andrade is 6 weeks post-operative from a robot-assisted laparoscopic colpopexy, cystoscopy on 10/17/22.    Her convalescence has been unremarkable.     Today she denies presence of any bothersome pain / pressure, vaginal discharge or bleeding.  She denies difficulty with micturition or UTI symptoms.  She does feel that she empties her bladder well - better than she has in years.     She denies constipation or defecatory dysfunction and feels like bowel movements have better since surgery than they have been since she was 30.  She is not using fiber supplements.    She has not been sexually active.     She is not using vaginal estrogen as the cream burned when she used it and her pharmacist encouraged her to stop.    OBJECTIVE:  Vitals:  Most Recent :  Vitals:    11/25/22 1551   BP: 134/64   BP Source: Arm, Left Upper   Pulse: 69   Temp: 36.4 ?C (97.6 ?F)   Resp: 16   SpO2: 97%   TempSrc: Temporal   PainSc: Zero   Weight: 87.3 kg (192 lb 6.4 oz)   Height: 167.6 cm (5' 6)       Physical Exam:  CONSTITUTIONAL:   Well developed.  Well nourished.   HEENT:  Thyroid nontender/not enlarged.  RESPIRATORY: Clear to auscultation bilaterally. Normal respiratory effort.  CARDIOVASCULAR: Regular rate and rhythm.  Normal heart sounds.  No murmurs.  Normal peripheral vascular exam.  ABDOMINAL:  Abdomen, soft and nontender. Incisions well-healed  SKIN:   Normal inspection.  MUSCULOSKELETAL: No deformities/weakness. Moves all extremities well.   NEUROLOGIC:  Normal reflexes.  PSYCHIATRIC:  Oriented X 3.    GENITOURINARY:  External Genitalia:  Normal appearance, no lesions.    Urethral Meatus: Normal size and location.  Urethra:  No masses or tenderness.  Bladder:  No masses or tenderness.  Vagina: Healing well  Cervix: Surgically absent  Uterus: Surgically absent  Adnexa/Parametrial:  No palpable masses, no tenderness.   Perineal Sensation: normal     POPQ Examination:    GH: 1 GHs: 1.5  PB: 3 PBs: 3  Aa: -3  Ba: -3  C: -11  D: N/A  TVL:  11  Ap: -3  Bp:  -3    ASSESSMENT:  Theresa Andrade is a 76 y.o. female who is 6 weeks post-operative from a robot-assisted laparoscopic colpopexy, cystoscopy on 10/17/22.    PLAN:   - Increase to 30 pound lifting restriction  - Continue vaginal rest  - May slowly begin to increase activity, increase duration/speed with walking, may resume stretching  - Continue stool softeners PRN for constipation  - Follow up at 12 weeks post-op

## 2023-01-13 ENCOUNTER — Encounter: Admit: 2023-01-13 | Discharge: 2023-01-13 | Payer: MEDICARE

## 2023-01-13 ENCOUNTER — Ambulatory Visit: Admit: 2023-01-13 | Discharge: 2023-01-14 | Payer: MEDICARE

## 2023-01-13 DIAGNOSIS — N819 Female genital prolapse, unspecified: Secondary | ICD-10-CM

## 2023-01-13 DIAGNOSIS — N183 CKD (chronic kidney disease) stage 3, GFR 30-59 ml/min (HCC): Secondary | ICD-10-CM

## 2023-01-13 DIAGNOSIS — J449 Chronic obstructive pulmonary disease, unspecified: Secondary | ICD-10-CM

## 2023-01-13 DIAGNOSIS — E7800 Pure hypercholesterolemia, unspecified: Secondary | ICD-10-CM

## 2023-01-13 DIAGNOSIS — Z4889 Encounter for other specified surgical aftercare: Secondary | ICD-10-CM

## 2023-01-13 DIAGNOSIS — I10 Essential (primary) hypertension: Secondary | ICD-10-CM

## 2023-01-13 DIAGNOSIS — N952 Postmenopausal atrophic vaginitis: Secondary | ICD-10-CM

## 2023-01-13 DIAGNOSIS — Z8719 Personal history of other diseases of the digestive system: Secondary | ICD-10-CM

## 2023-01-13 DIAGNOSIS — C50919 Malignant neoplasm of unspecified site of unspecified female breast: Secondary | ICD-10-CM

## 2023-01-13 NOTE — Progress Notes
Post-op Visit  HPI:  Theresa Andrade is 12 weeks post-operative from a robot-assisted laparoscopic colpopexy, cystoscopy on 10/17/22.      Her convalescence has been unremarkable.     Today she denies presence of any bothersome pain / pressure, vaginal discharge or bleeding.  She denies difficulty with micturition or UTI symptoms.  She reports some leakage, like little dribbles, at nighttime. Denies urinary symptoms other times throughout the day. Her PCP suggested Kegel exercises, which she has started with some improvement. She does feel that she empties her bladder well.     She reports one episode of constipation but notes her stools are moving better now.      She is not using vaginal estrogen.    OBJECTIVE:  Vitals:  Most Recent :  Vitals:    01/13/23 1316   BP: 116/63   Pulse: 53   Temp: 36.3 ?C (97.3 ?F)   Resp: 18   TempSrc: Temporal   PainSc: Zero   Weight: 88.5 kg (195 lb)   Height: 167.6 cm (5' 6)       Physical Exam:  CONSTITUTIONAL:   Well developed.  Well nourished.   HEENT:  Thyroid nontender/not enlarged.  RESPIRATORY: Clear to auscultation bilaterally. Normal respiratory effort.  CARDIOVASCULAR: Regular rate and rhythm.  Normal heart sounds.  No murmurs.  Normal peripheral vascular exam.  ABDOMINAL:  Abdomen, soft and nontender. Incisions well-healed  SKIN:   Normal inspection.  MUSCULOSKELETAL: No deformities/weakness. Moves all extremities well.   NEUROLOGIC:  Normal reflexes.  PSYCHIATRIC:  Oriented X 3.    GENITOURINARY:  External Genitalia:  Normal appearance, no lesions.    Urethral Meatus: Normal size and location.  Urethra:  No masses or tenderness.  Bladder:  No masses or tenderness.  Vagina: Well-healed, area with slight blueish coloration on the posterior vagina near the introitus, soft, non-tender  Cervix: Surgically absent  Uterus: Surgically absent  Adnexa/Parametrial:  No palpable masses, no tenderness.   Perineal Sensation: Normal      POPQ Examination: GH: 1 GHs: 1.5  PB: 3 PBs: 3  Aa: -3  Ba: -3  C: -12  D: N/A  TVL:  12  Ap: -2.5  Bp:  -2.5    ASSESSMENT:  Theresa Andrade is a 77 y.o. female who is 12 weeks post-operative from a robot-assisted laparoscopic colpopexy, cystoscopy on 10/17/22.     PLAN:   - Resume regular activities  - Agree with Kegel exercises for occasional SUI, recommend regular voiding every 2-3 hours during the day  - Follow up PRN

## 2023-07-07 IMAGING — CT CT abdomen/pelvis w/ IV con
2 series · 13 of 38 positions shown, 19 images · IV contrast (CONT)
Comparison: None available

STUDY TYPE: CT abdomen/pelvis with IV contrast
TECHNIQUE: After intravenous administration of contrast material, postcontrast CT abdomen/pelvis was performed. Coronal and sagittal reformatted images were generated. Rectal contrast was administered. 100 mL of Omnipaque 300 contrast medium was injected.
HISTORY: Unable to complete colonoscopy, history of rectal surgery for rectocele

[Series 1001: coronal w/ · coronal · 1.03mm/px · 12 of 101 slices shown, 17 images]
[im 9/101  soft-tissue]
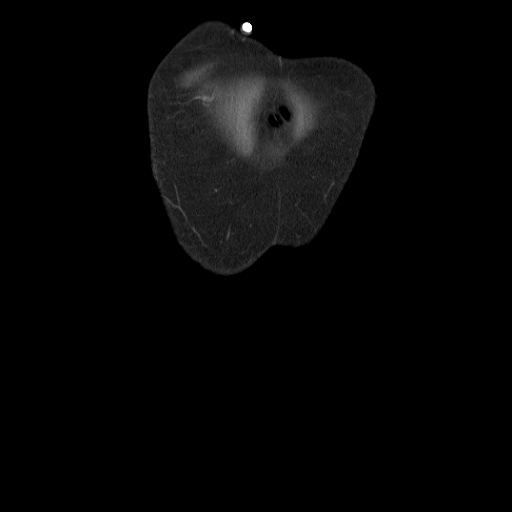
[im 9/101  lung]
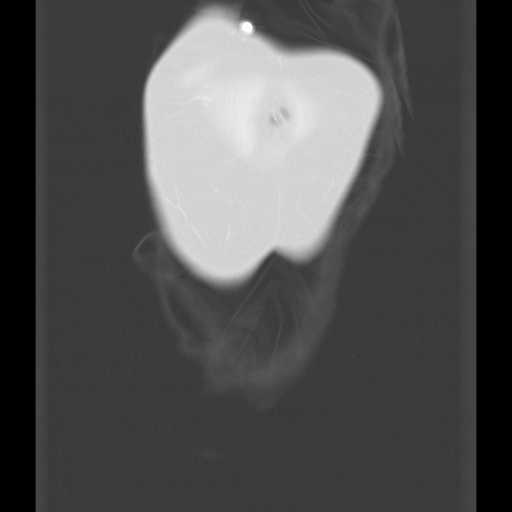
[im 9/101  bone]
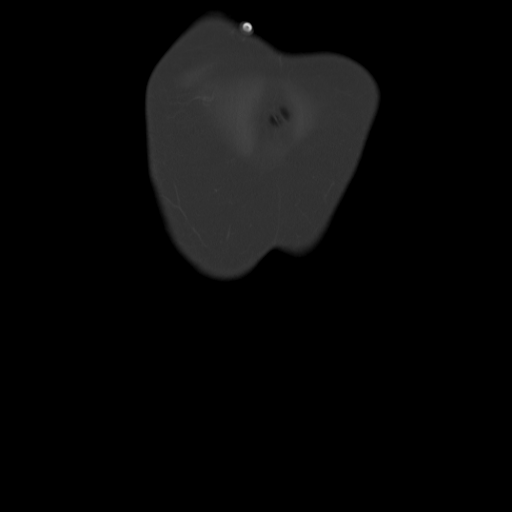
[im 17/101  soft-tissue]
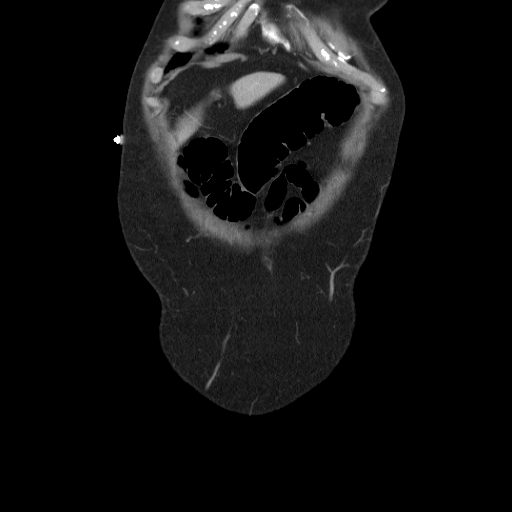
[im 17/101  lung]
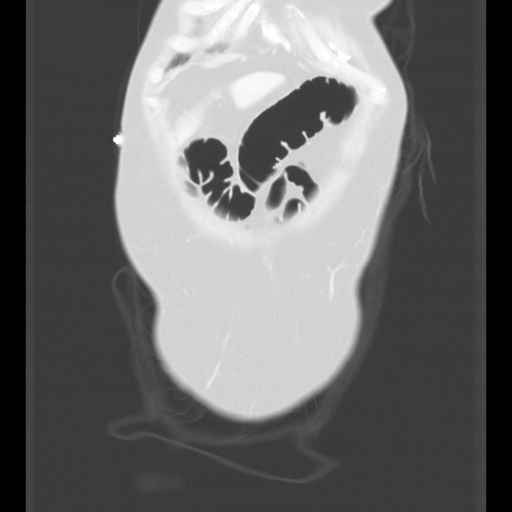
[im 23/101  soft-tissue]
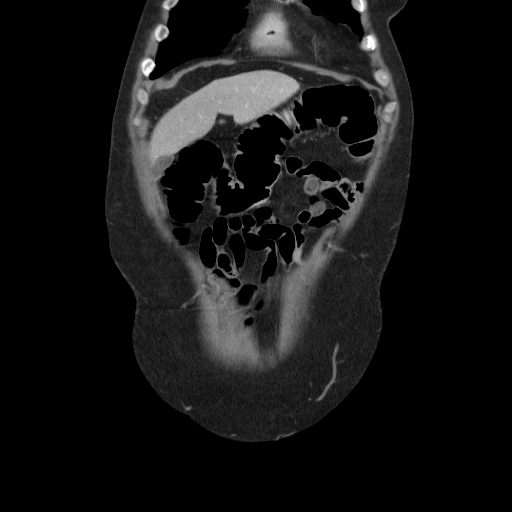
[im 26/101  lung]
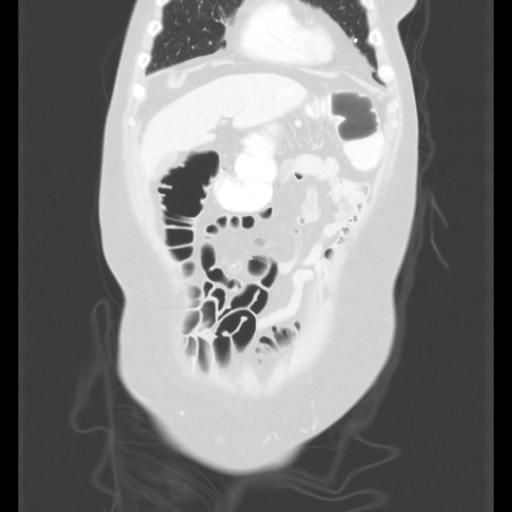
[im 34/101  soft-tissue]
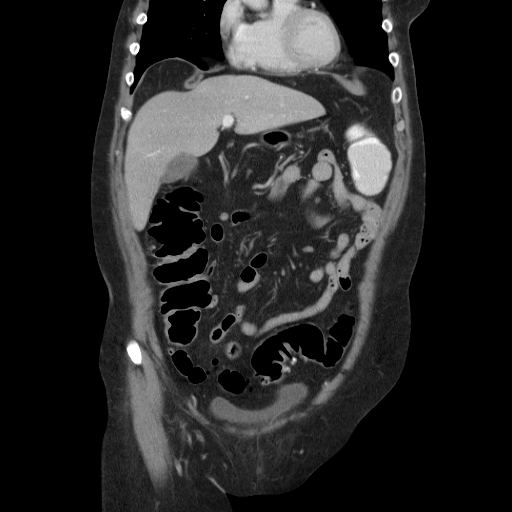
[im 34/101  lung]
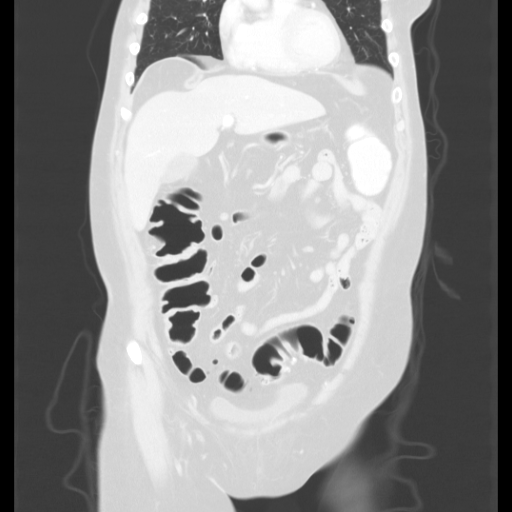
[im 42/101  soft-tissue]
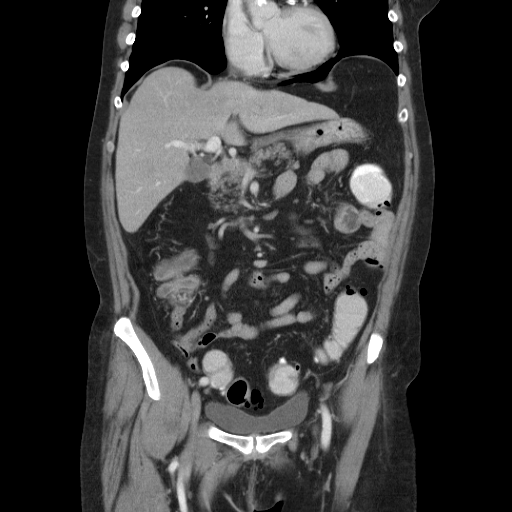
[im 51/101  soft-tissue]
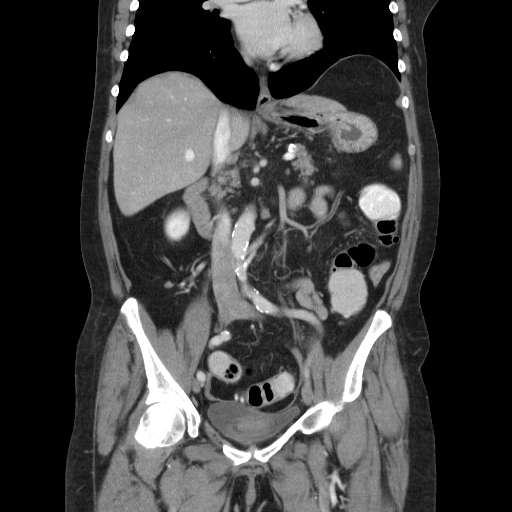
[im 59/101  soft-tissue]
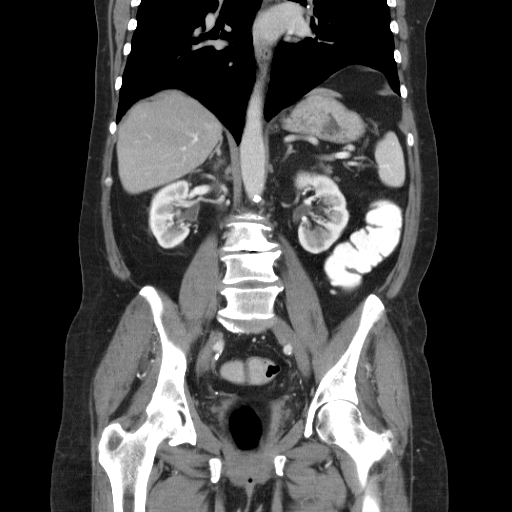
[im 67/101  soft-tissue]
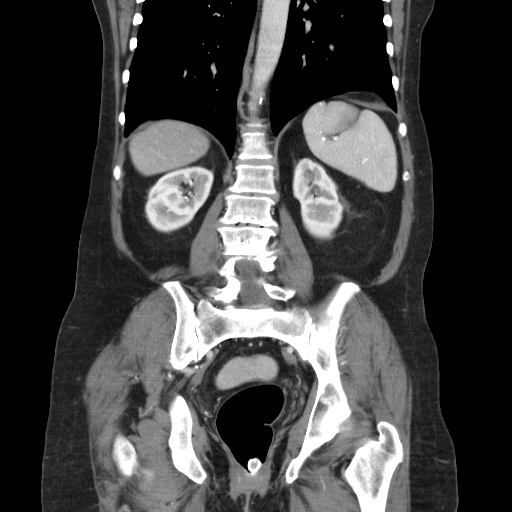
[im 78/101  soft-tissue]
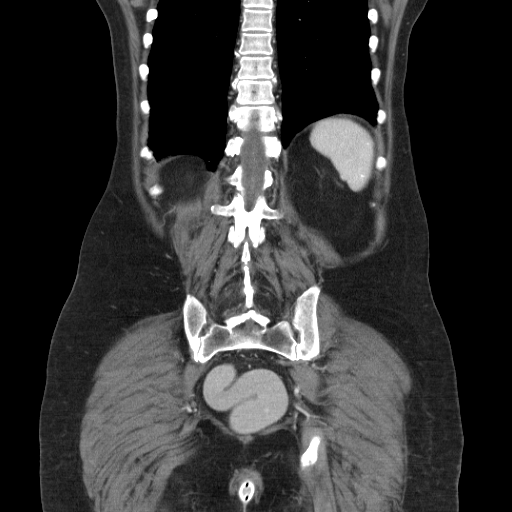
[im 78/101  bone]
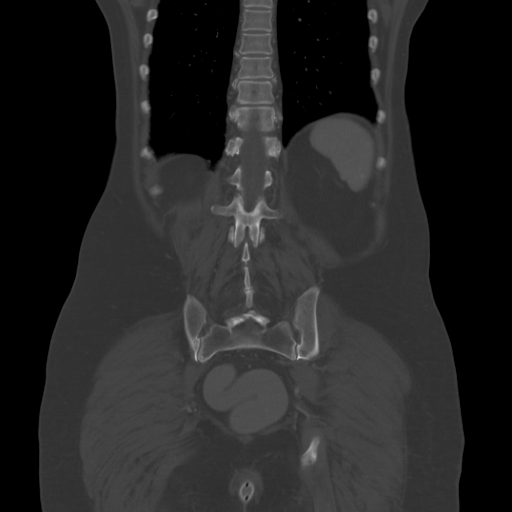
[im 84/101  soft-tissue]
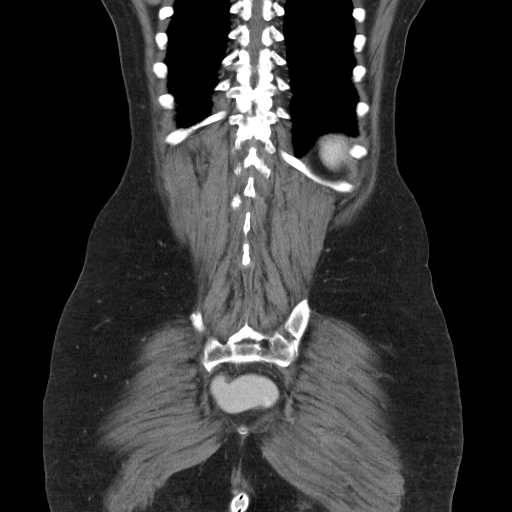
[im 92/101  soft-tissue]
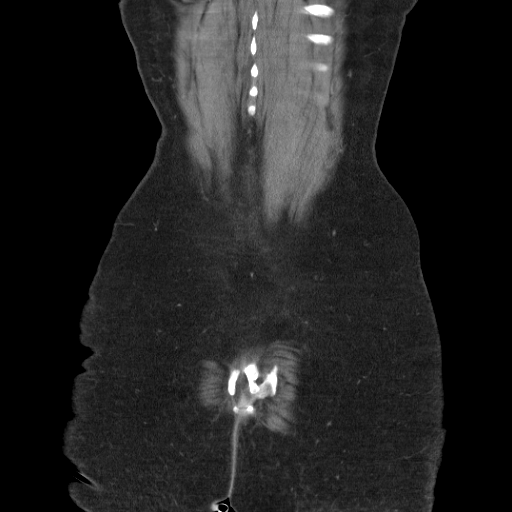

[Series 1002: sagittal w/ · sagittal · 1.03mm/px · 1 of 149 slices shown, 2 images]
[im 75/149  soft-tissue]
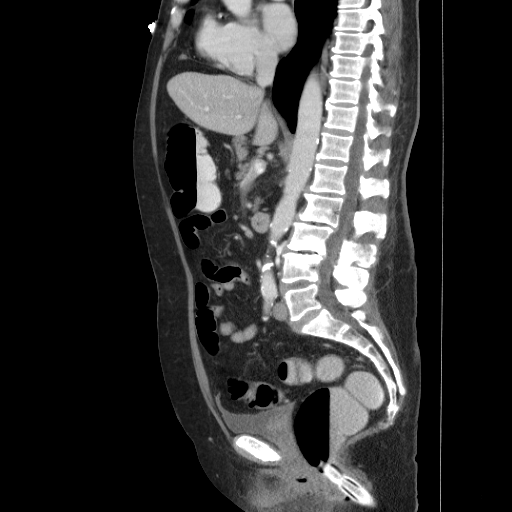
[im 75/149  bone]
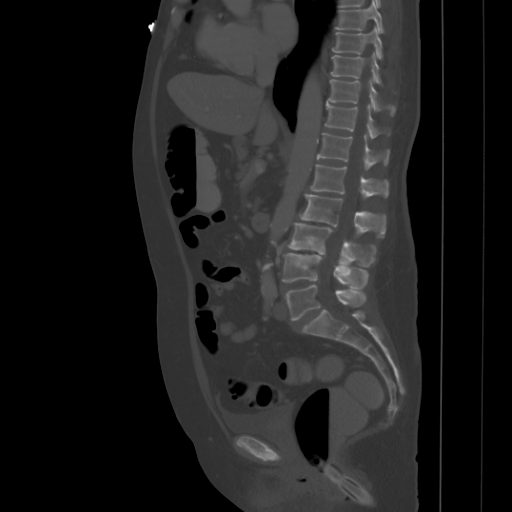

[13 of 38 positions shown; findings below may reference images not displayed]

FINDINGS: Lung bases appear overall clear. There is coronary arterial calcification.
The liver, gallbladder, spleen, atrophic pancreas, adrenal glands and kidneys appear normal.
There is small hiatus hernia which appears mildly thickened. There are findings suggesting gastritis. Oral contrast is seen within the rectum and propagating to the level of the proximal transverse colon. There is mild colonic diverticular disease. No discrete obstructing mass is seen. The sigmoid colon is somewhat tortuous. No obvious wall thickening. The appendix is normal. Small bowel is nondilated.
Within the pelvis, the uterus appears absent. Urinary bladder is thin walled moderately distended.
There is aortoiliac atherosclerosis with tortuosity no aneurysm. There is no free intraperitoneal air or ascites. There is no bowel containing ventral abdominal wall defect. Review of bony structures demonstrates mild lumbar spondylosis most notable at L2-L3 and L3-L4 with lower lumbar facet arthropathy.
IMPRESSION: 1. No CT evidence of obstructing colonic mass. There is mild sigmoid diverticular disease and tortuous sigmoid colon.
2. Normal appendix.
3. Small hiatus hernia. Mild gastritis is suspected.
4. Vascular calcification. Mild lumbar degenerative change.
